# Patient Record
Sex: Female | Born: 1990 | Race: Black or African American | Hispanic: No | Marital: Single | Smoking: Current every day smoker
Health system: Southern US, Community
[De-identification: ages and names within clinical notes are randomized; demographics above are authoritative.]

---

## 2017-07-16 ENCOUNTER — Emergency Department (HOSPITAL_COMMUNITY): Payer: Self-pay

## 2017-07-16 ENCOUNTER — Emergency Department (HOSPITAL_COMMUNITY)
Admission: EM | Admit: 2017-07-16 | Discharge: 2017-07-17 | Disposition: A | Discharge status: Home / Self Care | Payer: Self-pay | Attending: Emergency Medicine | Admitting: Emergency Medicine

## 2017-07-16 ENCOUNTER — Other Ambulatory Visit: Payer: Self-pay

## 2017-07-16 ENCOUNTER — Encounter (HOSPITAL_COMMUNITY): Payer: Self-pay | Admitting: Emergency Medicine

## 2017-07-16 DIAGNOSIS — F17200 Nicotine dependence, unspecified, uncomplicated: Secondary | ICD-10-CM | POA: Insufficient documentation

## 2017-07-16 DIAGNOSIS — B9689 Other specified bacterial agents as the cause of diseases classified elsewhere: Secondary | ICD-10-CM | POA: Insufficient documentation

## 2017-07-16 DIAGNOSIS — N739 Female pelvic inflammatory disease, unspecified: Secondary | ICD-10-CM | POA: Insufficient documentation

## 2017-07-16 DIAGNOSIS — N939 Abnormal uterine and vaginal bleeding, unspecified: Secondary | ICD-10-CM | POA: Insufficient documentation

## 2017-07-16 DIAGNOSIS — R102 Pelvic and perineal pain: Secondary | ICD-10-CM | POA: Insufficient documentation

## 2017-07-16 DIAGNOSIS — A599 Trichomoniasis, unspecified: Secondary | ICD-10-CM | POA: Insufficient documentation

## 2017-07-16 DIAGNOSIS — N760 Acute vaginitis: Secondary | ICD-10-CM | POA: Insufficient documentation

## 2017-07-16 LAB — CBC
HCT: 33.2 % — ABNORMAL LOW (ref 36.0–46.0)
Hemoglobin: 10 g/dL — ABNORMAL LOW (ref 12.0–15.0)
MCH: 22.9 pg — AB (ref 26.0–34.0)
MCHC: 30.1 g/dL (ref 30.0–36.0)
MCV: 76 fL — AB (ref 78.0–100.0)
PLATELETS: 312 10*3/uL (ref 150–400)
RBC: 4.37 MIL/uL (ref 3.87–5.11)
RDW: 21 % — AB (ref 11.5–15.5)
WBC: 6 10*3/uL (ref 4.0–10.5)

## 2017-07-16 LAB — TYPE AND SCREEN
ABO/RH(D): A POS
Antibody Screen: NEGATIVE

## 2017-07-16 LAB — I-STAT BETA HCG BLOOD, ED (MC, WL, AP ONLY): I-stat hCG, quantitative: <5 m[IU]/mL (ref <5)

## 2017-07-16 LAB — COMPREHENSIVE METABOLIC PANEL
ALBUMIN: 3.8 g/dL (ref 3.5–5.0)
ALT: 13 U/L — AB (ref 14–54)
AST: 27 U/L (ref 15–41)
Alkaline Phosphatase: 49 U/L (ref 38–126)
Anion gap: 11 (ref 5–15)
BUN: 11 mg/dL (ref 6–20)
CO2: 18 mmol/L — AB (ref 22–32)
CREATININE: 0.76 mg/dL (ref 0.44–1.00)
Calcium: 8.7 mg/dL — ABNORMAL LOW (ref 8.9–10.3)
Chloride: 105 mmol/L (ref 101–111)
GFR calc Af Amer: >60 mL/min (ref >60)
GLUCOSE: 159 mg/dL — AB (ref 65–99)
Potassium: 3.8 mmol/L (ref 3.5–5.1)
Sodium: 134 mmol/L — ABNORMAL LOW (ref 135–145)
Total Bilirubin: 0.4 mg/dL (ref 0.3–1.2)
Total Protein: 8.2 g/dL — ABNORMAL HIGH (ref 6.5–8.1)

## 2017-07-16 LAB — LIPASE, BLOOD: Lipase: 16 U/L (ref 11–51)

## 2017-07-16 LAB — ABO/RH: ABO/RH(D): A POS

## 2017-07-16 MED ORDER — MORPHINE SULFATE (PF) 4 MG/ML IV SOLN
4.0000 mg | Freq: Once | INTRAVENOUS | Status: AC
  Administered 2017-07-16: 4 mg via INTRAVENOUS
  Filled 2017-07-16: qty 1

## 2017-07-16 MED ORDER — IOPAMIDOL (ISOVUE-300) INJECTION 61%
INTRAVENOUS | Status: AC
  Administered 2017-07-17: 100 mL
  Filled 2017-07-16: qty 100

## 2017-07-16 MED ORDER — SODIUM CHLORIDE 0.9 % IV BOLUS (SEPSIS)
1000.0000 mL | Freq: Once | INTRAVENOUS | Status: AC
  Administered 2017-07-16: 1000 mL via INTRAVENOUS

## 2017-07-16 MED ORDER — ONDANSETRON HCL 4 MG/2ML IJ SOLN
4.0000 mg | Freq: Once | INTRAMUSCULAR | Status: AC
  Administered 2017-07-16: 4 mg via INTRAVENOUS
  Filled 2017-07-16: qty 2

## 2017-07-16 NOTE — ED Provider Notes (Signed)
[facility] EMERGENCY DEPARTMENT Provider Note   CSN: [id number] Arrival date & time: 07/16/17  1946     History   Chief Complaint Chief Complaint  Patient presents with  . Vaginal Bleeding  . Abdominal Pain    HPI Eileen Richmond is a 27 y.o. female With a history of vaginal bleeding requiring transfusion, who presents today for evaluation of 2-3 hours of abdominal pain and vaginal bleeding.  She reports that this feels similar to in the past when she had enough bleeding requiring a transfusion.  She denies any recent trauma to the area.  She denies the possibility of being pregnant, stating that she is not sexually active.  She reports that she did have an episode of diarrhea earlier this morning prior to the onset of all of her other symptoms.  She denies any constipation, no recent fevers or chills.  No dysuria hematuria.  HPI  History reviewed. No pertinent past medical history.  There are no active problems to display for this patient.   History reviewed. No pertinent surgical history.  OB History    No data available       Home Medications    Prior to Admission medications   Medication Sig Start Date End Date Taking? Authorizing Provider  doxycycline (VIBRAMYCIN) 100 MG capsule Take 1 capsule (100 mg total) by mouth 2 (two) times daily for 14 days. 07/17/17 07/31/17  Cristina Gong, PA-C  metroNIDAZOLE (FLAGYL) 500 MG tablet Take 1 tablet (500 mg total) by mouth 2 (two) times daily for 14 days. 07/17/17 07/31/17  Cristina Gong, PA-C  ondansetron (ZOFRAN ODT) 4 MG disintegrating tablet Take 1 tablet (4 mg total) by mouth every 8 (eight) hours as needed for nausea or vomiting. 07/17/17   Cristina Gong, PA-C    Family History History reviewed. No pertinent family history.  Social History Social History   Tobacco Use  . Smoking status: Current Every Day Smoker  . Smokeless tobacco: Never Used  Substance Use Topics  . Alcohol use:  No    Frequency: Never  . Drug use: No     Allergies   Sulfa antibiotics   Review of Systems Review of Systems  Constitutional: Negative for chills and fever.  HENT: Negative for congestion.   Eyes: Negative for visual disturbance.  Respiratory: Negative for shortness of breath.   Cardiovascular: Negative for chest pain.  Gastrointestinal: Positive for abdominal pain, diarrhea, nausea and vomiting.  Genitourinary: Positive for pelvic pain, vaginal bleeding and vaginal pain. Negative for flank pain.  Neurological: Negative for headaches.  Hematological: Does not bruise/bleed easily.  All other systems reviewed and are negative.    Physical Exam Updated Vital Signs BP 105/68   Pulse 85   Resp 18   LMP 06/30/2017 (Within Weeks)   SpO2 100%   Physical Exam  Constitutional: She is oriented to person, place, and time. She appears well-developed and well-nourished. She appears distressed (writhing in pain on bed.).  HENT:  Head: Normocephalic and atraumatic.  Eyes: Conjunctivae are normal.  Neck: Neck supple.  Cardiovascular: Normal rate, regular rhythm and intact distal pulses.  No murmur heard. Pulmonary/Chest: Effort normal and breath sounds normal. No respiratory distress.  Abdominal: Soft. Normal appearance and bowel sounds are normal. There is tenderness in the suprapubic area and left lower quadrant. There is no rigidity and no guarding.  Genitourinary: Uterus is not tender. Cervix exhibits motion tenderness (Scant) and discharge. Right adnexum displays no mass, no tenderness  and no fullness. Left adnexum displays tenderness. Left adnexum displays no mass and no fullness. There is bleeding in the vagina.  Genitourinary Comments: No obvious lacerations or wounds.   Musculoskeletal: She exhibits no edema.  Neurological: She is alert and oriented to person, place, and time.  Skin: Skin is warm and dry.  Psychiatric: She has a normal mood and affect. Her behavior is  normal.  Nursing note and vitals reviewed.    ED Treatments / Results  Labs (all labs ordered are listed, but only abnormal results are displayed) Labs Reviewed  WET PREP, GENITAL - Abnormal; Notable for the following components:      Result Value   Trich, Wet Prep PRESENT (*)    Clue Cells Wet Prep HPF POC PRESENT (*)    WBC, Wet Prep HPF POC FEW (*)    All other components within normal limits  COMPREHENSIVE METABOLIC PANEL - Abnormal; Notable for the following components:   Sodium 134 (*)    CO2 18 (*)    Glucose, Bld 159 (*)    Calcium 8.7 (*)    Total Protein 8.2 (*)    ALT 13 (*)    All other components within normal limits  CBC - Abnormal; Notable for the following components:   Hemoglobin 10.0 (*)    HCT 33.2 (*)    MCV 76.0 (*)    MCH 22.9 (*)    RDW 21.0 (*)    All other components within normal limits  LIPASE, BLOOD  URINALYSIS, ROUTINE W REFLEX MICROSCOPIC  I-STAT BETA HCG BLOOD, ED ([facility], [facility], [facility] ONLY)  ABO/RH  TYPE AND SCREEN  GC/CHLAMYDIA PROBE AMP ([place]) NOT AT [facility]    EKG  EKG Interpretation None       Radiology US Transvaginal Non-ob  Result Date: 07/16/2017 CLINICAL DATA:  Initial evaluation for sudden onset severe left lower quadrant pain. EXAM: TRANSABDOMINAL AND TRANSVAGINAL ULTRASOUND OF PELVIS DOPPLER ULTRASOUND OF OVARIES TECHNIQUE: Both transabdominal and transvaginal ultrasound examinations of the pelvis were performed. Transabdominal technique was performed for global imaging of the pelvis including uterus, ovaries, adnexal regions, and pelvic cul-de-sac. It was necessary to proceed with endovaginal exam following the transabdominal exam to visualize the uterus, endometrium, and ovaries. Color and duplex Doppler ultrasound was utilized to evaluate blood flow to the ovaries. COMPARISON:  None. FINDINGS: Uterus Measurements: 6.8 x 4.8 x 4.9 cm. No fibroids or other mass visualized. Endometrium Thickness: 4.9 mm.  No focal abnormality  visualized. Right ovary Measurements: 4.7 x 2.3 x 4.6 cm. Well-circumscribed anechoic cyst measuring 2.0 x 1.0 x 2.0 cm present. Single thin internal septation without solid component or vascularity. Left ovary Measurements: 3.2 x 1.8 x 2.7 cm. Normal appearance/no adnexal mass. Pulsed Doppler evaluation of both ovaries demonstrates normal low-resistance arterial and venous waveforms. Other findings No abnormal free fluid. IMPRESSION: 1. No acute abnormality within the pelvis.  No evidence for torsion. 2. 2 cm septated right ovarian cyst. This finding is almost certainly benign, with no follow-up imaging recommended regarding this lesion. Electronically Signed   By: Rise Mu M.D.   On: 07/16/2017 23:03   US Pelvis Complete  Result Date: 07/16/2017 CLINICAL DATA:  Initial evaluation for sudden onset severe left lower quadrant pain. EXAM: TRANSABDOMINAL AND TRANSVAGINAL ULTRASOUND OF PELVIS DOPPLER ULTRASOUND OF OVARIES TECHNIQUE: Both transabdominal and transvaginal ultrasound examinations of the pelvis were performed. Transabdominal technique was performed for global imaging of the pelvis including uterus, ovaries, adnexal regions, and pelvic cul-de-sac. It  was necessary to proceed with endovaginal exam following the transabdominal exam to visualize the uterus, endometrium, and ovaries. Color and duplex Doppler ultrasound was utilized to evaluate blood flow to the ovaries. COMPARISON:  None. FINDINGS: Uterus Measurements: 6.8 x 4.8 x 4.9 cm. No fibroids or other mass visualized. Endometrium Thickness: 4.9 mm.  No focal abnormality visualized. Right ovary Measurements: 4.7 x 2.3 x 4.6 cm. Well-circumscribed anechoic cyst measuring 2.0 x 1.0 x 2.0 cm present. Single thin internal septation without solid component or vascularity. Left ovary Measurements: 3.2 x 1.8 x 2.7 cm. Normal appearance/no adnexal mass. Pulsed Doppler evaluation of both ovaries demonstrates normal low-resistance arterial and venous  waveforms. Other findings No abnormal free fluid. IMPRESSION: 1. No acute abnormality within the pelvis.  No evidence for torsion. 2. 2 cm septated right ovarian cyst. This finding is almost certainly benign, with no follow-up imaging recommended regarding this lesion. Electronically Signed   By: Rise MuBenjamin  McClintock M.D.   On: 07/16/2017 23:03   Ct Abdomen Pelvis W Contrast  Result Date: 07/17/2017 CLINICAL DATA:  27 year old female with abdominal pain and vaginal bleeding. Nausea vomiting. Negative HCG levels. EXAM: CT ABDOMEN AND PELVIS WITH CONTRAST TECHNIQUE: Multidetector CT imaging of the abdomen and pelvis was performed using the standard protocol following bolus administration of intravenous contrast. CONTRAST:  100mL ISOVUE-300 IOPAMIDOL (ISOVUE-300) INJECTION 61% COMPARISON:  Pelvic ultrasound dated 07/16/2017 FINDINGS: Lower chest: The visualized lung bases are clear. No intra-abdominal free air.  Small free fluid within the pelvis. Hepatobiliary: A 15 mm hypodense lesion in the left lobe of the liver is not well characterized but may represent a cyst or hemangioma. The liver is otherwise unremarkable. No intrahepatic biliary ductal dilatation. The gallbladder is unremarkable. Pancreas: Unremarkable. No pancreatic ductal dilatation or surrounding inflammatory changes. Spleen: Normal in size without focal abnormality. Adrenals/Urinary Tract: The adrenal glands, kidneys, visualized ureters and urinary bladder appear unremarkable. Stomach/Bowel: There is no bowel obstruction. Mildly thickened appearance of the small bowel loops and colon most likely related to underdistention. Clinical correlation is recommended. The appendix is normal. Vascular/Lymphatic: The abdominal aorta and IVC are unremarkable. No portal venous gas. There is no adenopathy. Reproductive: The uterus is retroverted and appears unremarkable. There is a 2 cm right ovarian cyst. A 2.4 cm ovoid low attenuating structure within the right  ovary likely corresponds to the corpus luteum seen on the ultrasound. Hydrosalpinx is less likely and not visualized on the earlier ultrasound. A subcentimeter left ovarian follicle or corpus luteum is also noted. Other: None Musculoskeletal: No acute or significant osseous findings. IMPRESSION: 1. No bowel obstruction. Under distention of the bowel versus less likely enterocolitis. Clinical correlation is recommended. Normal appendix. 2. Right ovarian cyst and corpus luteum. Electronically Signed   By: Elgie CollardArash  Radparvar M.D.   On: 07/17/2017 00:35   [place] Art/ven Flow Abd Pelv Doppler  Result Date: 07/16/2017 CLINICAL DATA:  Initial evaluation for sudden onset severe left lower quadrant pain. EXAM: TRANSABDOMINAL AND TRANSVAGINAL ULTRASOUND OF PELVIS DOPPLER ULTRASOUND OF OVARIES TECHNIQUE: Both transabdominal and transvaginal ultrasound examinations of the pelvis were performed. Transabdominal technique was performed for global imaging of the pelvis including uterus, ovaries, adnexal regions, and pelvic cul-de-sac. It was necessary to proceed with endovaginal exam following the transabdominal exam to visualize the uterus, endometrium, and ovaries. Color and duplex Doppler ultrasound was utilized to evaluate blood flow to the ovaries. COMPARISON:  None. FINDINGS: Uterus Measurements: 6.8 x 4.8 x 4.9 cm. No fibroids or other mass visualized. Endometrium Thickness:  4.9 mm.  No focal abnormality visualized. Right ovary Measurements: 4.7 x 2.3 x 4.6 cm. Well-circumscribed anechoic cyst measuring 2.0 x 1.0 x 2.0 cm present. Single thin internal septation without solid component or vascularity. Left ovary Measurements: 3.2 x 1.8 x 2.7 cm. Normal appearance/no adnexal mass. Pulsed Doppler evaluation of both ovaries demonstrates normal low-resistance arterial and venous waveforms. Other findings No abnormal free fluid. IMPRESSION: 1. No acute abnormality within the pelvis.  No evidence for torsion. 2. 2 cm septated right  ovarian cyst. This finding is almost certainly benign, with no follow-up imaging recommended regarding this lesion. Electronically Signed   By: Rise Mu M.D.   On: 07/16/2017 23:03    Procedures Procedures (including critical care time)  Medications Ordered in ED Medications  cefTRIAXone (ROCEPHIN) injection 250 mg (not administered)  morphine 4 MG/ML injection 4 mg (4 mg Intravenous Given 07/16/17 2107)  ondansetron (ZOFRAN) injection 4 mg (4 mg Intravenous Given 07/16/17 2107)  sodium chloride 0.9 % bolus 1,000 mL (0 mLs Intravenous Stopped 07/16/17 2142)  iopamidol (ISOVUE-300) 61 % injection (100 mLs  Contrast Given 07/17/17 0011)     Initial Impression / Assessment and Plan / ED Course  I have reviewed the triage vital signs and the nursing notes.  Pertinent labs & imaging results that were available during my care of the patient were reviewed by me and considered in my medical decision making (see chart for details).  Clinical Course as of Jul 17 114  Wed Jul 16, 2017  2111 Lab in room to draw.  While evaluating patient she had an episode of vomiting.  Verbal orders for morphine and zofran given.   [EH]  2141 Attempted to call ultrasound to expedite scan based on concern for torsion, no answer.   [EH]  2145 Spoke with ultrasound who will perform her study next.   [EH]  2146 Patient re-evaluated, appears much more comfortable.  Will still send for scan.   [EH]  2356 Pelvic exam performed with chaperone in room.  Mild CMT.  Based on patient nature of pain intermittent cramping in nature discussed role of CT with patient including risks of radiation.  She wishes for CT scan.   [EH]    Clinical Course User Index [EH] Cristina Gong, PA-C   [facility] Presents today for evaluation of vaginal bleeding and abdominal pain.  Initially on my exam she was very uncomfortable, writhing in pain on the bed and vomiting.  She was treated with Zofran and morphine.  Based on her  presentation, extent of pain, and localization to left lower quadrant concern for ovarian torsion.  Patient is not pregnant.  Ultrasound was performed showing normal blood flow to ovaries with physiologic appearing cyst.  Pelvic exam performed showing mild cervical motion tenderness.  Based on the extreme nature of patient's pain earlier concerned that she may have a ureteral stone.  Discussed with patient the risks and benefits of CT, including that I expect will most likely be normal.  Patient wished to proceed with CT scan which did not show any acute abnormalities.  Wet prep positive for Trichomonas and BV.  She had mild cervical motion tenderness on her pelvic exam, based on the extent of her pain this most likely represents pelvic inflammatory disease.  She will be treated with Flagyl for PID, BV, and trichomonas, doxycycline for PID, and Rocephin IM for PID.  She will be given Zofran to help with nausea help her tolerate these antibiotics.  She  was given strict return precautions, states her understanding.  Given follow-up with women's outpatient clinic as she does not have a local OB/GYN.  She was informed that she has test pending for gonorrhea and chlamydia, however her PID treatment should cover gonorrhea and chlamydia if they are positive.  She was advised not to drink alcohol while taking Flagyl.  Urine not collected, however based on lack of urinary symptoms, patient elected for discharge with out UA. No CVA tenderness to percussion.  Discharged home.   Final Clinical Impressions(s) / ED Diagnoses   Final diagnoses:  Pelvic inflammatory disease (PID)  Trichomoniasis  BV (bacterial vaginosis)  Vaginal bleeding  Pelvic pain    ED Discharge Orders        Ordered    doxycycline (VIBRAMYCIN) 100 MG capsule  2 times daily     07/17/17 0109    metroNIDAZOLE (FLAGYL) 500 MG tablet  2 times daily     07/17/17 0109    ondansetron (ZOFRAN ODT) 4 MG disintegrating tablet  Every 8 hours PRN      07/17/17 0109       Cristina Gong, PA-C 07/17/17 9147    Pricilla Loveless, MD 07/17/17 [phone]

## 2017-07-16 NOTE — ED Triage Notes (Signed)
Patient with abdominal pain and vaginal bleeding.  She states that this has been going on for a few hours, with nausea and vomiting.

## 2017-07-17 ENCOUNTER — Emergency Department (HOSPITAL_COMMUNITY): Payer: Self-pay

## 2017-07-17 LAB — GC/CHLAMYDIA PROBE AMP (~~LOC~~) NOT AT ARMC
Chlamydia: POSITIVE — AB
NEISSERIA GONORRHEA: NEGATIVE

## 2017-07-17 LAB — WET PREP, GENITAL
Sperm: NONE SEEN
Yeast Wet Prep HPF POC: NONE SEEN

## 2017-07-17 MED ORDER — CEFTRIAXONE SODIUM 250 MG IJ SOLR
250.0000 mg | Freq: Once | INTRAMUSCULAR | Status: AC
  Administered 2017-07-17: 250 mg via INTRAMUSCULAR
  Filled 2017-07-17: qty 250

## 2017-07-17 MED ORDER — DOXYCYCLINE HYCLATE 100 MG PO CAPS: 100.0000 mg | ORAL_CAPSULE | Freq: Two times a day (BID) | ORAL | 0 refills | Status: AC

## 2017-07-17 MED ORDER — ONDANSETRON 4 MG PO TBDP: 4.0000 mg | ORAL_TABLET | Freq: Three times a day (TID) | ORAL | 0 refills | Status: AC | PRN

## 2017-07-17 MED ORDER — METRONIDAZOLE 500 MG PO TABS: 500.0000 mg | ORAL_TABLET | Freq: Two times a day (BID) | ORAL | 0 refills | Status: AC

## 2017-07-17 MED ORDER — LIDOCAINE HCL (PF) 1 % IJ SOLN
INTRAMUSCULAR | Status: AC
  Administered 2017-07-17: 01:00:00
  Filled 2017-07-17: qty 5

## 2017-07-17 NOTE — Discharge Instructions (Signed)
You may have diarrhea from the antibiotics.  It is very important that you continue to take the antibiotics even if you get diarrhea unless a medical professional tells you that you may stop taking them.  If you stop too early the bacteria you are being treated for will become stronger and you may need different, more powerful antibiotics that have more side effects and worsening diarrhea.  Please stay well hydrated and consider probiotics as they may decrease the severity of your diarrhea.  Please be aware that if you take any hormonal contraception (birth control pills, nexplanon, the ring, etc) that your birth control will not work while you are taking antibiotics and you need to use back up protection as directed on the birth control medication information insert.   Today you received medications that may make you sleepy or impair your ability to make decisions.  For the next 24 hours please do not drive, operate heavy machinery, care for a small child with out another adult present, or perform any activities that may cause harm to you or someone else if you were to fall asleep or be impaired.   Zofran may make you sleepy.    Please take Ibuprofen (Advil, motrin) and Tylenol (acetaminophen) to relieve your pain.  You may take up to 600 MG (3 pills) of normal strength ibuprofen every 8 hours as needed.  In between doses of ibuprofen you make take tylenol, up to 1,000 mg (two extra strength pills).  Do not take more than 3,000 mg tylenol in a 24 hour period.  Please check all medication labels as many medications such as pain and cold medications may contain tylenol.  Do not drink alcohol while taking these medications.  Do not take other NSAID'S while taking ibuprofen (such as aleve or naproxen).  Please take ibuprofen with food to decrease stomach upset.  Please make sure you are staying well hydrated.

## 2019-10-28 IMAGING — CT CT ABD-PELV W/ CM
2 of 4 series · 16 of 46 positions shown, 18 images · IV contrast (iopamidol)
Comparison: Pelvic ultrasound dated 07/16/2017

CLINICAL DATA: 26-year-old female with abdominal pain and vaginal
bleeding. Nausea vomiting. Negative HCG levels.

EXAM:
CT ABDOMEN AND PELVIS WITH CONTRAST
TECHNIQUE: Multidetector CT imaging of the abdomen and pelvis was performed
using the standard protocol following bolus administration of
intravenous contrast.
CONTRAST:  100mL 4DIJRE-SII IOPAMIDOL (4DIJRE-SII) INJECTION 61%

[Series 3: a/p w/ 5mm · axial · 0.70mm/px · z∈[+788,+1178]mm · 13 of 92 slices shown, 15 images]
[im 7/92  soft-tissue]
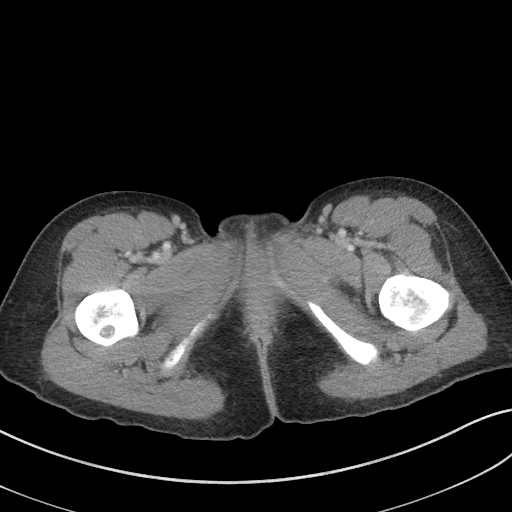
[im 7/92  bone]
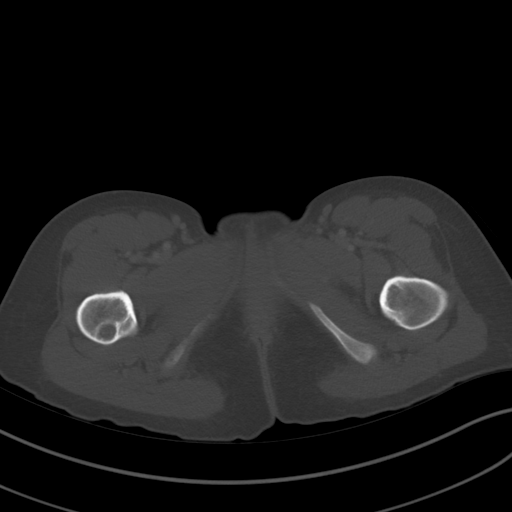
[im 14/92  soft-tissue]
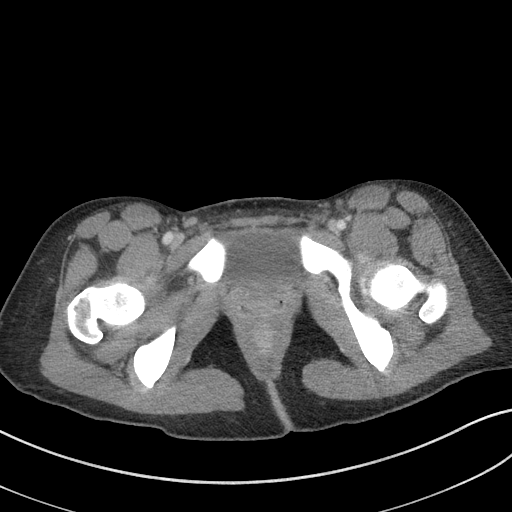
[im 20/92  soft-tissue]
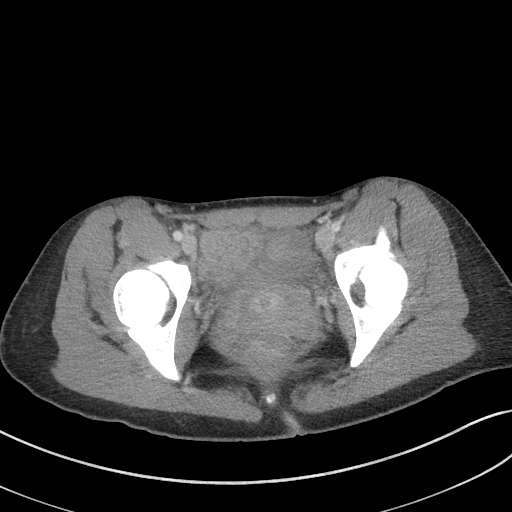
[im 27/92  soft-tissue]
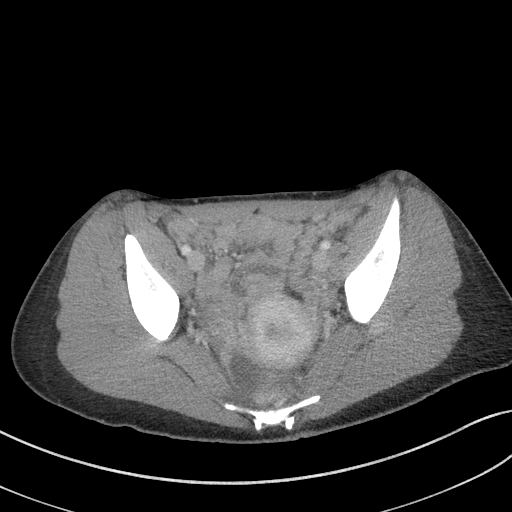
[im 33/92  soft-tissue]
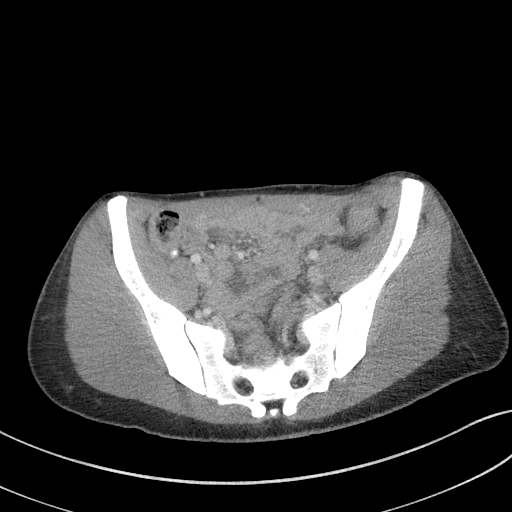
[im 40/92  soft-tissue]
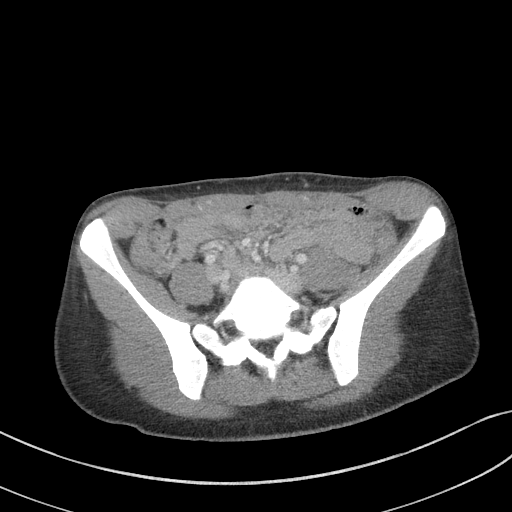
[im 46/92  soft-tissue]
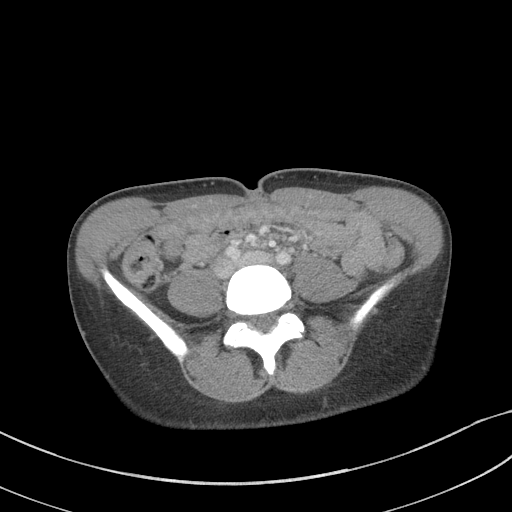
[im 53/92  soft-tissue]
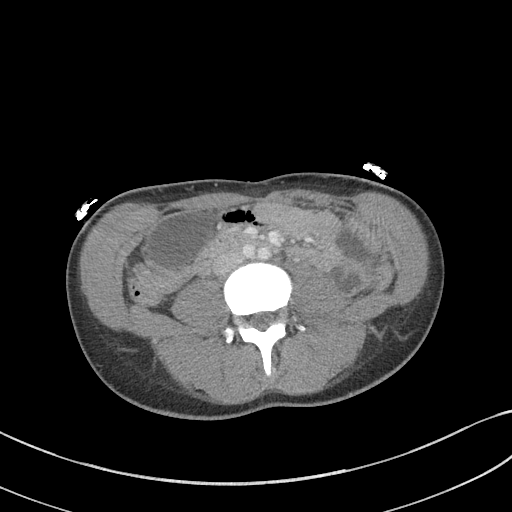
[im 59/92  soft-tissue]
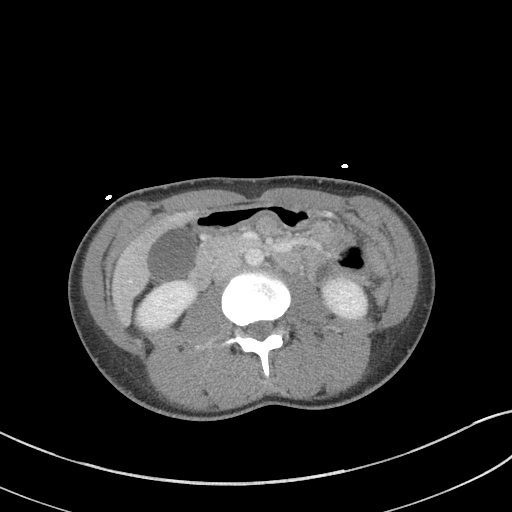
[im 59/92  bone]
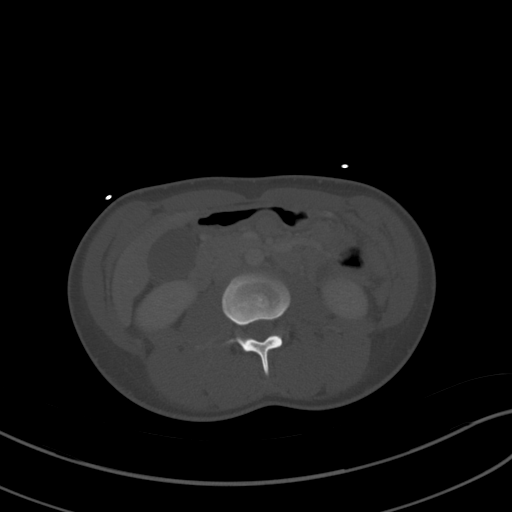
[im 66/92  soft-tissue]
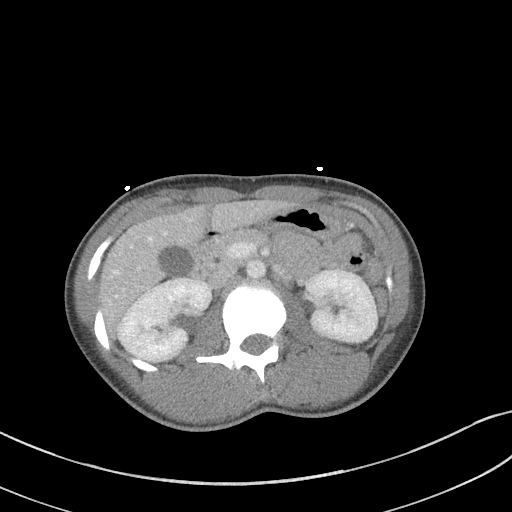
[im 72/92  soft-tissue]
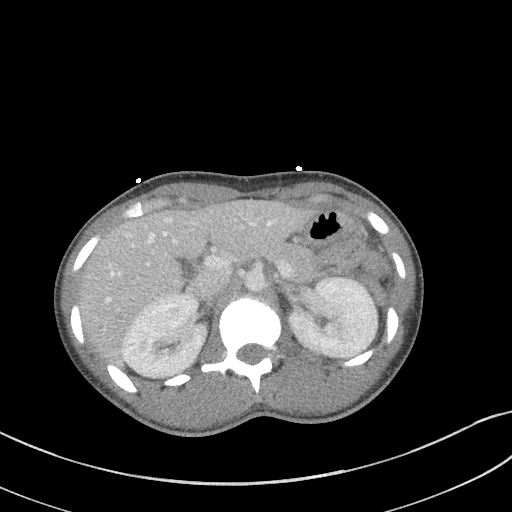
[im 79/92  soft-tissue]
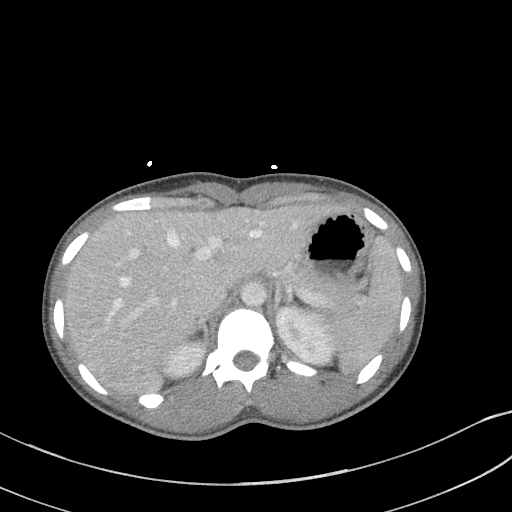
[im 85/92  soft-tissue]
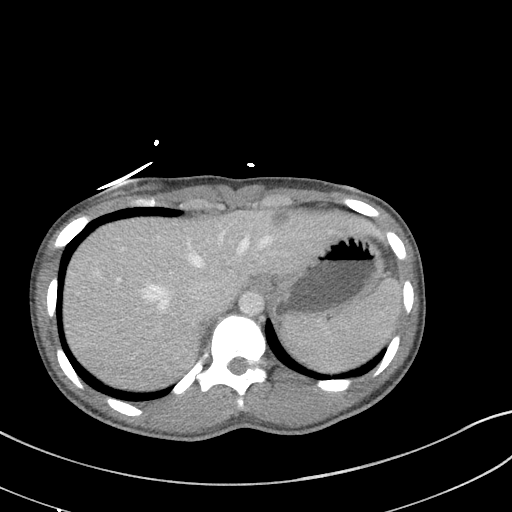

[Series 6: a/p w/ cor · coronal · 0.72mm/px · 3 of 93 slices shown]
[im 31/93  soft-tissue]
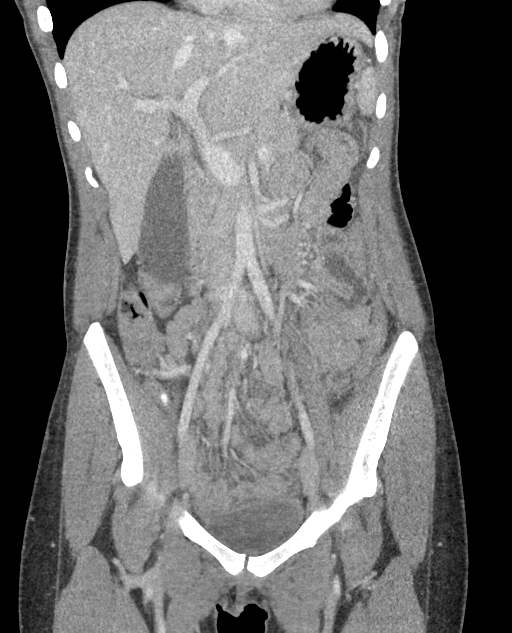
[im 41/93  soft-tissue]
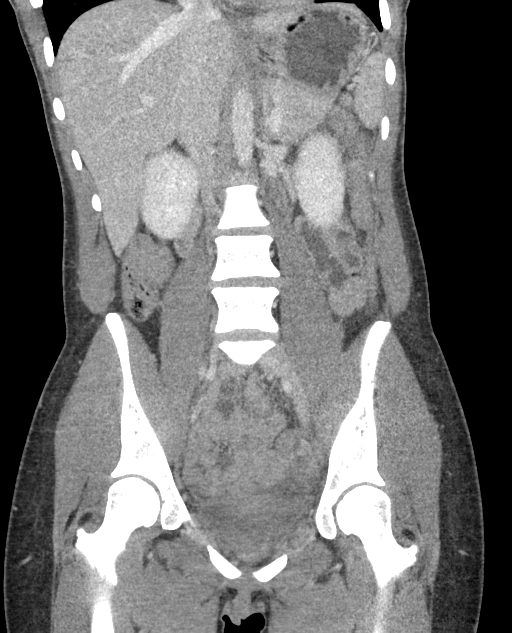
[im 52/93  soft-tissue]
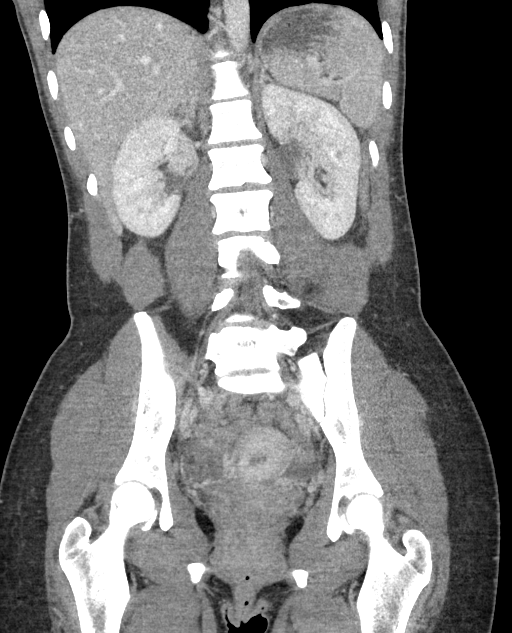

[16 of 46 positions shown; findings below may reference images not displayed]

FINDINGS: Lower chest: The visualized lung bases are clear.

No intra-abdominal free air.  Small free fluid within the pelvis.

Hepatobiliary: A 15 mm hypodense lesion in the left lobe of the
liver is not well characterized but may represent a cyst or
hemangioma. The liver is otherwise unremarkable. No intrahepatic
biliary ductal dilatation. The gallbladder is unremarkable.

Pancreas: Unremarkable. No pancreatic ductal dilatation or
surrounding inflammatory changes.

Spleen: Normal in size without focal abnormality.

Adrenals/Urinary Tract: The adrenal glands, kidneys, visualized
ureters and urinary bladder appear unremarkable.

Stomach/Bowel: There is no bowel obstruction. Mildly thickened
appearance of the small bowel loops and colon most likely related to
underdistention. Clinical correlation is recommended. The appendix
is normal.

Vascular/Lymphatic: The abdominal aorta and IVC are unremarkable. No
portal venous gas. There is no adenopathy.

Reproductive: The uterus is retroverted and appears unremarkable.
There is a 2 cm right ovarian cyst. A 2.4 cm ovoid low attenuating
structure within the right ovary likely corresponds to the corpus
luteum seen on the ultrasound. Hydrosalpinx is less likely and not
visualized on the earlier ultrasound. A subcentimeter left ovarian
follicle or corpus luteum is also noted.

Other: None

Musculoskeletal: No acute or significant osseous findings.
IMPRESSION: 1. No bowel obstruction. Under distention of the bowel versus less
likely enterocolitis. Clinical correlation is recommended. Normal
appendix.
2. Right ovarian cyst and corpus luteum.

## 2023-06-21 DEATH — deceased
